# Patient Record
Sex: Male | Born: 1990 | Race: White | Hispanic: No | Marital: Single | Smoking: Current some day smoker
Health system: Southern US, Community
[De-identification: ages and names within clinical notes are randomized; demographics above are authoritative.]

## PROBLEM LIST (undated history)

## (undated) DIAGNOSIS — M549 Dorsalgia, unspecified: Secondary | ICD-10-CM

---

## 2015-02-19 ENCOUNTER — Emergency Department (HOSPITAL_COMMUNITY): Payer: Self-pay

## 2015-02-19 ENCOUNTER — Observation Stay (HOSPITAL_COMMUNITY)
Admission: EM | Admit: 2015-02-19 | Discharge: 2015-02-21 | Disposition: A | Discharge status: Home / Self Care | Payer: Self-pay | Attending: Internal Medicine | Admitting: Internal Medicine

## 2015-02-19 ENCOUNTER — Encounter (HOSPITAL_COMMUNITY): Payer: Self-pay | Admitting: Emergency Medicine

## 2015-02-19 ENCOUNTER — Emergency Department (HOSPITAL_COMMUNITY): Payer: No Typology Code available for payment source

## 2015-02-19 DIAGNOSIS — S32011A Stable burst fracture of first lumbar vertebra, initial encounter for closed fracture: Principal | ICD-10-CM | POA: Insufficient documentation

## 2015-02-19 DIAGNOSIS — S32009A Unspecified fracture of unspecified lumbar vertebra, initial encounter for closed fracture: Secondary | ICD-10-CM

## 2015-02-19 DIAGNOSIS — E876 Hypokalemia: Secondary | ICD-10-CM | POA: Insufficient documentation

## 2015-02-19 DIAGNOSIS — F17200 Nicotine dependence, unspecified, uncomplicated: Secondary | ICD-10-CM | POA: Insufficient documentation

## 2015-02-19 DIAGNOSIS — S32020A Wedge compression fracture of second lumbar vertebra, initial encounter for closed fracture: Secondary | ICD-10-CM | POA: Insufficient documentation

## 2015-02-19 DIAGNOSIS — R945 Abnormal results of liver function studies: Secondary | ICD-10-CM | POA: Diagnosis present

## 2015-02-19 DIAGNOSIS — R7989 Other specified abnormal findings of blood chemistry: Secondary | ICD-10-CM | POA: Diagnosis present

## 2015-02-19 DIAGNOSIS — Z720 Tobacco use: Secondary | ICD-10-CM | POA: Diagnosis present

## 2015-02-19 DIAGNOSIS — D72829 Elevated white blood cell count, unspecified: Secondary | ICD-10-CM | POA: Insufficient documentation

## 2015-02-19 DIAGNOSIS — R740 Nonspecific elevation of levels of transaminase and lactic acid dehydrogenase [LDH]: Secondary | ICD-10-CM | POA: Insufficient documentation

## 2015-02-19 DIAGNOSIS — S32001A Stable burst fracture of unspecified lumbar vertebra, initial encounter for closed fracture: Secondary | ICD-10-CM

## 2015-02-19 HISTORY — DX: Dorsalgia, unspecified: M54.9

## 2015-02-19 LAB — COMPREHENSIVE METABOLIC PANEL
ALT: 37 U/L (ref 17–63)
ANION GAP: 8 (ref 5–15)
AST: 45 U/L — ABNORMAL HIGH (ref 15–41)
Albumin: 3.2 g/dL — ABNORMAL LOW (ref 3.5–5.0)
Alkaline Phosphatase: 88 U/L (ref 38–126)
BUN: 8 mg/dL (ref 6–20)
CALCIUM: 9 mg/dL (ref 8.9–10.3)
CHLORIDE: 109 mmol/L (ref 101–111)
CO2: 23 mmol/L (ref 22–32)
CREATININE: 0.9 mg/dL (ref 0.61–1.24)
Glucose, Bld: 102 mg/dL — ABNORMAL HIGH (ref 65–99)
Potassium: 4 mmol/L (ref 3.5–5.1)
Sodium: 140 mmol/L (ref 135–145)
TOTAL PROTEIN: 6.4 g/dL — AB (ref 6.5–8.1)
Total Bilirubin: 0.2 mg/dL — ABNORMAL LOW (ref 0.3–1.2)

## 2015-02-19 LAB — CBC
HCT: 42.1 % (ref 39.0–52.0)
HEMOGLOBIN: 14.3 g/dL (ref 13.0–17.0)
MCH: 28.9 pg (ref 26.0–34.0)
MCHC: 34 g/dL (ref 30.0–36.0)
MCV: 85.1 fL (ref 78.0–100.0)
Platelets: 296 10*3/uL (ref 150–400)
RBC: 4.95 MIL/uL (ref 4.22–5.81)
RDW: 12.9 % (ref 11.5–15.5)
WBC: 19 10*3/uL — ABNORMAL HIGH (ref 4.0–10.5)

## 2015-02-19 LAB — MAGNESIUM: MAGNESIUM: 2 mg/dL (ref 1.7–2.4)

## 2015-02-19 LAB — PROTIME-INR
INR: 1.18 (ref 0.00–1.49)
PROTHROMBIN TIME: 15.1 s (ref 11.6–15.2)

## 2015-02-19 LAB — ETHANOL

## 2015-02-19 LAB — CK: CK TOTAL: 161 U/L (ref 49–397)

## 2015-02-19 MED ORDER — IOHEXOL 300 MG/ML  SOLN
100.0000 mL | Freq: Once | INTRAMUSCULAR | Status: AC | PRN
  Administered 2015-02-19: 100 mL via INTRAVENOUS

## 2015-02-19 MED ORDER — CYCLOBENZAPRINE HCL 10 MG PO TABS
5.0000 mg | ORAL_TABLET | ORAL | Status: DC | PRN
  Administered 2015-02-19 – 2015-02-21 (×5): 5 mg via ORAL
  Filled 2015-02-19 (×6): qty 1

## 2015-02-19 MED ORDER — CYCLOBENZAPRINE HCL 10 MG PO TABS
10.0000 mg | ORAL_TABLET | Freq: Once | ORAL | Status: DC
  Administered 2015-02-19: 10 mg via ORAL
  Filled 2015-02-19: qty 1

## 2015-02-19 MED ORDER — MORPHINE SULFATE (PF) 4 MG/ML IV SOLN
4.0000 mg | Freq: Once | INTRAVENOUS | Status: AC
  Administered 2015-02-19: 4 mg via INTRAVENOUS
  Filled 2015-02-19: qty 1

## 2015-02-19 MED ORDER — KETOROLAC TROMETHAMINE 30 MG/ML IJ SOLN
30.0000 mg | Freq: Four times a day (QID) | INTRAMUSCULAR | Status: DC
  Administered 2015-02-19 – 2015-02-20 (×4): 30 mg via INTRAVENOUS
  Filled 2015-02-19 (×4): qty 1

## 2015-02-19 MED ORDER — NICOTINE 21 MG/24HR TD PT24
21.0000 mg | MEDICATED_PATCH | Freq: Every day | TRANSDERMAL | Status: DC
  Administered 2015-02-19 – 2015-02-21 (×4): 21 mg via TRANSDERMAL
  Filled 2015-02-19 (×4): qty 1

## 2015-02-19 MED ORDER — HYDROMORPHONE HCL 1 MG/ML IJ SOLN
1.0000 mg | INTRAMUSCULAR | Status: DC | PRN
  Administered 2015-02-19 – 2015-02-20 (×4): 1 mg via INTRAVENOUS
  Filled 2015-02-19 (×4): qty 1

## 2015-02-19 MED ORDER — HYDROMORPHONE HCL 1 MG/ML IJ SOLN
1.0000 mg | Freq: Once | INTRAMUSCULAR | Status: AC
  Administered 2015-02-19: 1 mg via INTRAVENOUS
  Filled 2015-02-19: qty 1

## 2015-02-19 MED ORDER — POTASSIUM CHLORIDE IN NACL 20-0.9 MEQ/L-% IV SOLN
INTRAVENOUS | Status: DC
  Administered 2015-02-19 – 2015-02-21 (×2): via INTRAVENOUS
  Filled 2015-02-19 (×6): qty 1000

## 2015-02-19 MED ORDER — ACETAMINOPHEN 325 MG PO TABS: 650.0000 mg | ORAL_TABLET | Freq: Four times a day (QID) | ORAL | Status: DC | PRN

## 2015-02-19 MED ORDER — ONDANSETRON HCL 4 MG/2ML IJ SOLN
4.0000 mg | Freq: Four times a day (QID) | INTRAMUSCULAR | Status: DC | PRN
  Administered 2015-02-19: 4 mg via INTRAVENOUS
  Filled 2015-02-19: qty 2

## 2015-02-19 MED ORDER — FAMOTIDINE 20 MG PO TABS
20.0000 mg | ORAL_TABLET | Freq: Two times a day (BID) | ORAL | Status: DC
  Administered 2015-02-19 – 2015-02-21 (×4): 20 mg via ORAL
  Filled 2015-02-19 (×4): qty 1

## 2015-02-19 NOTE — ED Notes (Signed)
Provider at bedside
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
## 2015-02-19 NOTE — ED Notes (Signed)
Admit Doctor at bedside.  
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
## 2015-02-20 ENCOUNTER — Observation Stay (HOSPITAL_COMMUNITY): Payer: Self-pay

## 2015-02-20 DIAGNOSIS — S32001D Stable burst fracture of unspecified lumbar vertebra, subsequent encounter for fracture with routine healing: Secondary | ICD-10-CM

## 2015-02-20 DIAGNOSIS — E876 Hypokalemia: Secondary | ICD-10-CM

## 2015-02-20 LAB — CBC WITH DIFFERENTIAL/PLATELET
BASOS ABS: 0 10*3/uL (ref 0.0–0.1)
Basophils Relative: 0 %
EOS ABS: 0.2 10*3/uL (ref 0.0–0.7)
EOS PCT: 1 %
HCT: 42.3 % (ref 39.0–52.0)
Hemoglobin: 14.4 g/dL (ref 13.0–17.0)
Lymphocytes Relative: 19 %
Lymphs Abs: 3.2 10*3/uL (ref 0.7–4.0)
MCH: 28.7 pg (ref 26.0–34.0)
MCHC: 34 g/dL (ref 30.0–36.0)
MCV: 84.4 fL (ref 78.0–100.0)
Monocytes Absolute: 1.2 10*3/uL — ABNORMAL HIGH (ref 0.1–1.0)
Monocytes Relative: 7 %
Neutro Abs: 12.1 10*3/uL — ABNORMAL HIGH (ref 1.7–7.7)
Neutrophils Relative %: 73 %
PLATELETS: 304 10*3/uL (ref 150–400)
RBC: 5.01 MIL/uL (ref 4.22–5.81)
RDW: 12.9 % (ref 11.5–15.5)
WBC: 16.6 10*3/uL — AB (ref 4.0–10.5)

## 2015-02-20 LAB — COMPREHENSIVE METABOLIC PANEL
ALT: 34 U/L (ref 17–63)
AST: 33 U/L (ref 15–41)
Albumin: 3.1 g/dL — ABNORMAL LOW (ref 3.5–5.0)
Alkaline Phosphatase: 84 U/L (ref 38–126)
Anion gap: 6 (ref 5–15)
BILIRUBIN TOTAL: 0.3 mg/dL (ref 0.3–1.2)
BUN: 7 mg/dL (ref 6–20)
CO2: 25 mmol/L (ref 22–32)
CREATININE: 0.72 mg/dL (ref 0.61–1.24)
Calcium: 8.5 mg/dL — ABNORMAL LOW (ref 8.9–10.3)
Chloride: 105 mmol/L (ref 101–111)
GFR calc Af Amer: >60 mL/min (ref >60)
Glucose, Bld: 108 mg/dL — ABNORMAL HIGH (ref 65–99)
Potassium: 3.4 mmol/L — ABNORMAL LOW (ref 3.5–5.1)
SODIUM: 136 mmol/L (ref 135–145)
TOTAL PROTEIN: 6.2 g/dL — AB (ref 6.5–8.1)

## 2015-02-20 MED ORDER — HYDROMORPHONE HCL 1 MG/ML IJ SOLN
2.0000 mg | INTRAMUSCULAR | Status: DC | PRN
  Administered 2015-02-20 – 2015-02-21 (×7): 2 mg via INTRAVENOUS
  Filled 2015-02-20 (×8): qty 2
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
## 2015-02-20 NOTE — Progress Notes (Signed)
OT Cancellation Note  Patient Details Name: Jerry PastelCorbin Wells MRN: [id number] DOB: 07/21/1990   Cancelled Treatment:    Reason Eval/Treat Not Completed: Patient not medically ready. PT/OT Contacted MD regarding activity order, per MD hold PT/OT until upright lumbar spine radiograph obtained. OT will continue to follow acutely, thank you for this order.  Earlie RavelingStraub, Kameran Mcneese L OTR/L [phone] 02/20/2015, 3:15 PM

## 2015-02-20 NOTE — Progress Notes (Signed)
Orthopedic Tech Progress Note Patient Details:  Jerry Wells 09/07/1990 [id number] Brace order completed by [employer] vendor. Patient ID: Jerry PastelCorbin Wells, male   DOB: 08/24/1990, 24 y.o.   MRN: [id number]   Jerry Wells, Jerry Wells 02/20/2015, 11:42 AM

## 2015-02-20 NOTE — Progress Notes (Signed)
PT Cancellation Note  Patient Details Name: Jerry Wells MRN: [id number] DOB: 04/29/1991   Cancelled Treatment:    Reason Eval/Treat Not Completed: Patient not medically ready.  Contacted MD regarding activity order, per MD hold PT/OT until upright lumbar spine radiograph obtained.  PT will continue to follow acutely, thank you for this order.  Michail JewelsAshley Parr PT, DPT [phone] Pager: [phone] 02/20/2015, 3:13 PM

## 2015-02-20 NOTE — Progress Notes (Signed)
Orthopedic Tech Progress Note Patient Details:  Norton PastelCorbin Whipkey 08/02/1990 [id number] Called [employer] for brace order. Patient ID: Norton PastelCorbin Lad, male   DOB: 07/04/1990, 24 y.o.   MRN: [id number]   Jennye MoccasinHughes, Tobby Fawcett Craig 02/20/2015, 9:47 AM
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
## 2015-02-20 NOTE — Progress Notes (Signed)
Ortho paged to get TLSO brace per Md order.

## 2015-02-21 DIAGNOSIS — D72829 Elevated white blood cell count, unspecified: Secondary | ICD-10-CM

## 2015-02-21 DIAGNOSIS — Z720 Tobacco use: Secondary | ICD-10-CM | POA: Diagnosis present

## 2015-02-21 DIAGNOSIS — E876 Hypokalemia: Secondary | ICD-10-CM | POA: Diagnosis present

## 2015-02-21 MED ORDER — NICOTINE 21 MG/24HR TD PT24: 21.0000 mg | MEDICATED_PATCH | Freq: Every day | TRANSDERMAL | Status: AC

## 2015-02-21 MED ORDER — OXYCODONE-ACETAMINOPHEN 10-325 MG PO TABS: 1.0000 | ORAL_TABLET | ORAL | Status: AC | PRN

## 2015-02-21 MED ORDER — CYCLOBENZAPRINE HCL 5 MG PO TABS: 5.0000 mg | ORAL_TABLET | Freq: Four times a day (QID) | ORAL | Status: DC | PRN

## 2015-02-21 MED ORDER — NICOTINE 21 MG/24HR TD PT24: 21.0000 mg | MEDICATED_PATCH | Freq: Every day | TRANSDERMAL | Status: DC

## 2015-02-21 MED ORDER — DOCUSATE SODIUM 100 MG PO CAPS: 100.0000 mg | ORAL_CAPSULE | Freq: Every day | ORAL | Status: AC | PRN
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
## 2015-02-21 NOTE — Discharge Instructions (Signed)
Lumbar Fracture °A lumbar fracture is a break in one of the bones of the lower back. Lumbar fractures range in severity. Severe fractures can damage the spinal cord. °CAUSES °This condition may be caused by: °· A fall (common). °· A car accident (common). °· A gunshot wound. °· A hard, direct hit to the back. °· Osteoporosis. °SYMPTOMS °The main symptom of this condition is severe pain in the lower back. If a fracture is complex or severe, there may also be: °· A misshapen or swollen area on the lower back. °· A limited ability to move an area of the lower back. °· An inability to empty the bladder or bowel. °· A loss of strength or sensation in the legs, feet, and toes. °· Paralysis. °DIAGNOSIS °This condition is diagnosed based on: °· A physical exam. °· Symptoms and what happened just before they developed. °· The results of imaging tests, such as an X-ray, CT scan, or MRI. °If your nerves have been damaged, you may also have other tests to find out how much damage there is. °TREATMENT °Treatment for this condition depends on the specifics of the injury. Most fractures can be treated with: °· A back brace. °· Bed rest and activity restrictions. °· Pain medicine. °· Physical therapy. °Fractures that are complex, involve multiple bones, or make the spine unstable may require surgery to remove pressure from the nerves or spinal cord and to stabilize the broken pieces of bone. °During recovery, it is normal to have pain and stiffness in the back for weeks. °HOME CARE INSTRUCTIONS °Medicines °· Take medicines only as directed by your health care provider. °· Do not drive or operate heavy machinery while taking pain medicine. ° Activity °· Stay in bed for as long as directed by your health care provider. °· If you were shown how to do any exercises to improve motion and strength in your back, do them as directed by your health care provider. °· Return to your normal activities as directed by your health care provider.  Ask your health care provider what activities are safe for you. °General Instructions °· If you were given a neck brace or back brace, wear it as directed by your health care provider. °· Keep all follow-up visits as directed by your health care provider. This is important. Failure to follow-up as recommended could result in permanent injury, disability, and long-lasting (chronic) pain. °SEEK MEDICAL CARE IF: °· Your pain does not improve over time. °· You have a persistent cough. °· You cannot return to your normal activities as planned or expected. °SEEK IMMEDIATE MEDICAL CARE IF: °· You have severe pain or your pain suddenly gets worse. °· You are unable to move. °· You have numbness, tingling, weakness, or paralysis in any part of your body. °· You cannot control your bladder or bowel. °· You have difficulty breathing. °· You have a fever. °· You have pain in your chest or abdomen. °· You vomit. °  °This information is not intended to replace advice given to you by your health care provider. Make sure you discuss any questions you have with your health care provider. °  °Document Released: 08/02/2006 Document Revised: 09/01/2014 Document Reviewed: 04/13/2014 °[employer]vier Interactive Patient Education ©2016 [employer] ° °
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
## 2015-02-21 NOTE — Progress Notes (Signed)
Patient continues to complain of back pain, however it seems to be improving. AVSS Neurologically intact I reviewed his upright lumbar films. This fracture appears to be stable when he is upright in the brace. He is doing well and I hope that we'll be able to get by with bracing only. He may be discharged at this time. I would recommend sending him home with opioids and muscle relaxants. He will follow up with me in 4 weeks with upright films.
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
## 2015-02-21 NOTE — Progress Notes (Signed)
Pt discharged at this time alert, verbal taking all personal belongings. Family at bedside. IV discontinued, dry dressing applied. Discharge instructions provided along with prescriptions with verbal understanding. Pt and mother made aware to follow up with Md per discharge summary. No noted distress.

## 2015-02-21 NOTE — Progress Notes (Signed)
Family requested a hospital bed for home. Spoke with CM who requested this nurse to contact Md for order. Md paged.

## 2015-02-23 ENCOUNTER — Telehealth: Payer: Self-pay | Admitting: *Deleted

## 2015-02-23 MED ORDER — IOHEXOL 300 MG/ML  SOLN
100.0000 mL | Freq: Once | INTRAMUSCULAR | Status: AC | PRN
  Administered 2015-02-19: 100 mL via INTRAVENOUS

## 2015-02-24 ENCOUNTER — Ambulatory Visit: Payer: Self-pay

## 2015-02-24 ENCOUNTER — Ambulatory Visit: Payer: Self-pay | Attending: Family Medicine | Admitting: Family Medicine

## 2015-02-24 ENCOUNTER — Encounter: Payer: Self-pay | Admitting: Family Medicine

## 2015-02-24 VITALS — BP 163/106 | HR 124 | Temp 98.2°F | Resp 18 | Ht 71.0 in | Wt 153.0 lb

## 2015-02-24 DIAGNOSIS — IMO0001 Reserved for inherently not codable concepts without codable children: Secondary | ICD-10-CM

## 2015-02-24 DIAGNOSIS — S32001D Stable burst fracture of unspecified lumbar vertebra, subsequent encounter for fracture with routine healing: Secondary | ICD-10-CM

## 2015-02-24 DIAGNOSIS — F17200 Nicotine dependence, unspecified, uncomplicated: Secondary | ICD-10-CM | POA: Insufficient documentation

## 2015-02-24 DIAGNOSIS — S32011D Stable burst fracture of first lumbar vertebra, subsequent encounter for fracture with routine healing: Secondary | ICD-10-CM | POA: Insufficient documentation

## 2015-02-24 DIAGNOSIS — R030 Elevated blood-pressure reading, without diagnosis of hypertension: Secondary | ICD-10-CM

## 2015-02-24 DIAGNOSIS — Z882 Allergy status to sulfonamides status: Secondary | ICD-10-CM | POA: Insufficient documentation
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
## 2015-02-24 NOTE — Patient Instructions (Signed)
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
## 2015-03-02 ENCOUNTER — Ambulatory Visit: Payer: Self-pay | Admitting: Family Medicine

## 2016-04-04 ENCOUNTER — Encounter (HOSPITAL_COMMUNITY): Payer: Self-pay | Admitting: Emergency Medicine

## 2016-04-04 ENCOUNTER — Emergency Department (HOSPITAL_COMMUNITY): Payer: Self-pay

## 2016-04-04 ENCOUNTER — Emergency Department (HOSPITAL_COMMUNITY)
Admission: EM | Admit: 2016-04-04 | Discharge: 2016-04-04 | Disposition: A | Discharge status: Home / Self Care | Payer: Self-pay | Attending: Emergency Medicine | Admitting: Emergency Medicine

## 2016-04-04 DIAGNOSIS — Y939 Activity, unspecified: Secondary | ICD-10-CM | POA: Insufficient documentation

## 2016-04-04 DIAGNOSIS — M5010 Cervical disc disorder with radiculopathy, unspecified cervical region: Secondary | ICD-10-CM | POA: Insufficient documentation

## 2016-04-04 DIAGNOSIS — Y999 Unspecified external cause status: Secondary | ICD-10-CM | POA: Insufficient documentation

## 2016-04-04 DIAGNOSIS — M5410 Radiculopathy, site unspecified: Secondary | ICD-10-CM

## 2016-04-04 DIAGNOSIS — S060X1A Concussion with loss of consciousness of 30 minutes or less, initial encounter: Secondary | ICD-10-CM | POA: Insufficient documentation

## 2016-04-04 DIAGNOSIS — F17200 Nicotine dependence, unspecified, uncomplicated: Secondary | ICD-10-CM | POA: Insufficient documentation

## 2016-04-04 DIAGNOSIS — Y92149 Unspecified place in prison as the place of occurrence of the external cause: Secondary | ICD-10-CM | POA: Insufficient documentation

## 2016-04-04 DIAGNOSIS — S060X1D Concussion with loss of consciousness of 30 minutes or less, subsequent encounter: Secondary | ICD-10-CM

## 2016-04-04 DIAGNOSIS — M5090 Cervical disc disorder, unspecified, unspecified cervical region: Secondary | ICD-10-CM

## 2016-04-04 DIAGNOSIS — S32000S Wedge compression fracture of unspecified lumbar vertebra, sequela: Secondary | ICD-10-CM | POA: Insufficient documentation

## 2016-04-04 DIAGNOSIS — Z79899 Other long term (current) drug therapy: Secondary | ICD-10-CM | POA: Insufficient documentation

## 2016-04-04 MED ORDER — CYCLOBENZAPRINE HCL 10 MG PO TABS: 10.0000 mg | ORAL_TABLET | Freq: Two times a day (BID) | ORAL | 0 refills | Status: AC | PRN

## 2016-04-04 MED ORDER — KETOROLAC TROMETHAMINE 60 MG/2ML IM SOLN
60.0000 mg | Freq: Once | INTRAMUSCULAR | Status: DC
  Filled 2016-04-04: qty 2

## 2016-04-04 MED ORDER — GABAPENTIN 300 MG PO CAPS: 300.0000 mg | ORAL_CAPSULE | Freq: Three times a day (TID) | ORAL | 0 refills | Status: AC

## 2016-04-04 NOTE — ED Triage Notes (Signed)
Pt is alert and oriented at triage

## 2016-04-04 NOTE — ED Notes (Signed)
Patient transported to X-ray 
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
## 2017-04-05 IMAGING — DX DG LUMBAR SPINE 2-3V
3 series · 3 of 3 positions shown · non-contrast
Comparison: 02/19/2015

CLINICAL DATA: Fracture lumbar vertebra, closed, standing images
with brace

EXAM:
LUMBAR SPINE - 2-3 VIEW

[w lumbar spine ap]
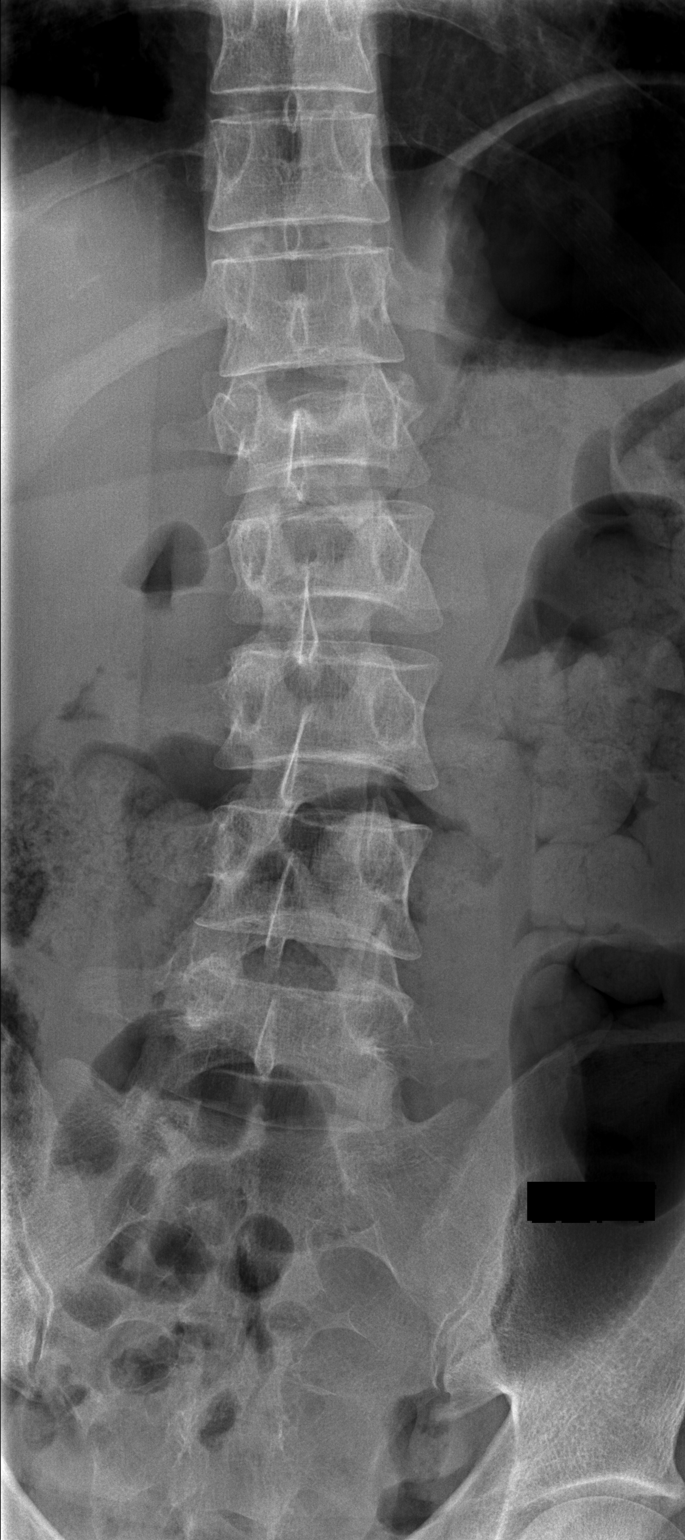

[w lumbar spine lat]
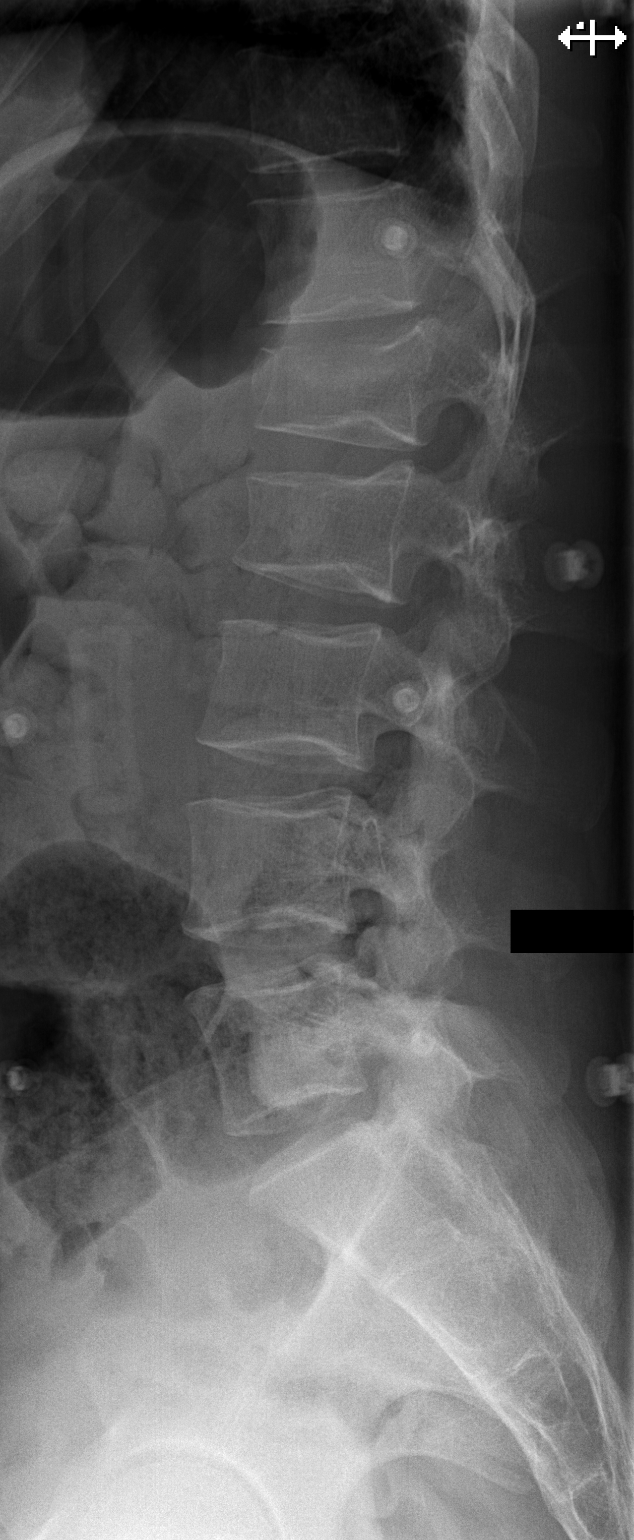

[w lumbar l-5 s-1 spot]
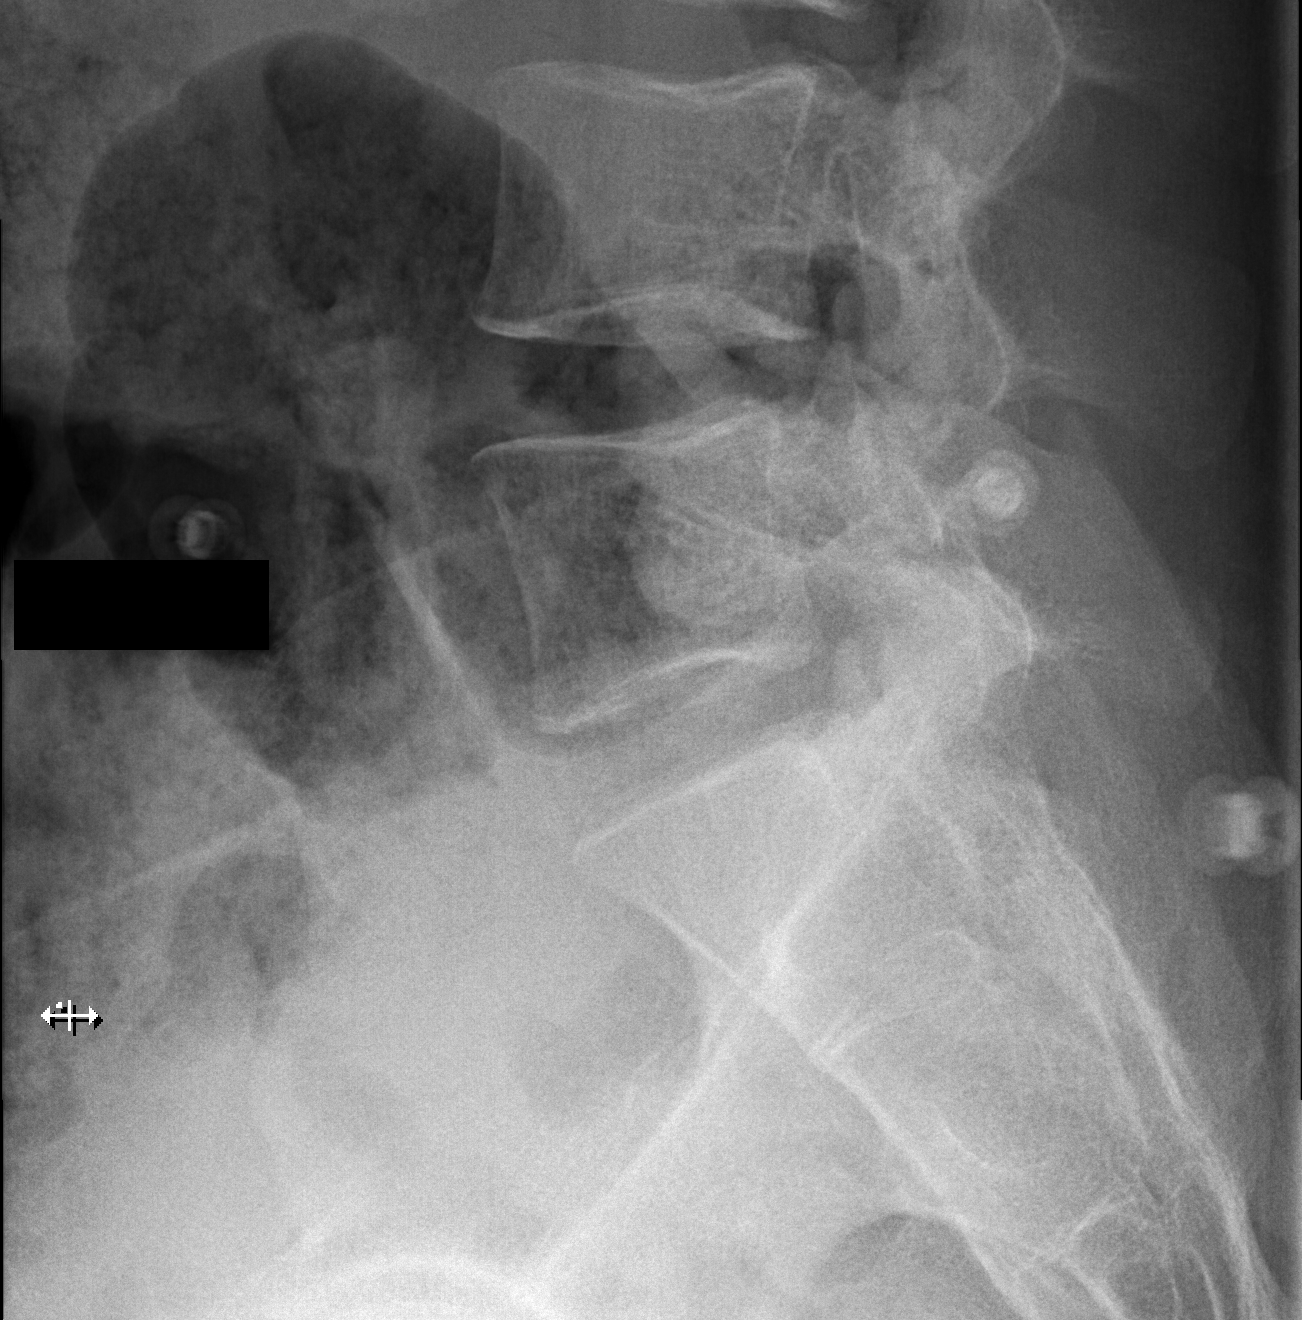

[3 of 3 positions shown; findings below may reference images not displayed]

FINDINGS: Five non-rib-bearing lumbar vertebra.

L1 fracture with anterior height loss and minimal retropulsion of a
posterior superior fragment appears unchanged.

Superior endplate height loss of L2 vertebral body appears
unchanged.

Bones appear demineralized.

No additional fracture or subluxation.
IMPRESSION: Fractures of L1 and L2 vertebral bodies unchanged since 02/19/2015.

## 2018-05-19 IMAGING — CR DG THORACIC SPINE 2V
3 series · 3 of 3 positions shown · non-contrast
Comparison: 12/20/2015.

CLINICAL DATA: Back pain.  Prior fracture.

EXAM:
THORACIC SPINE 2 VIEWS

[t-spine ap]
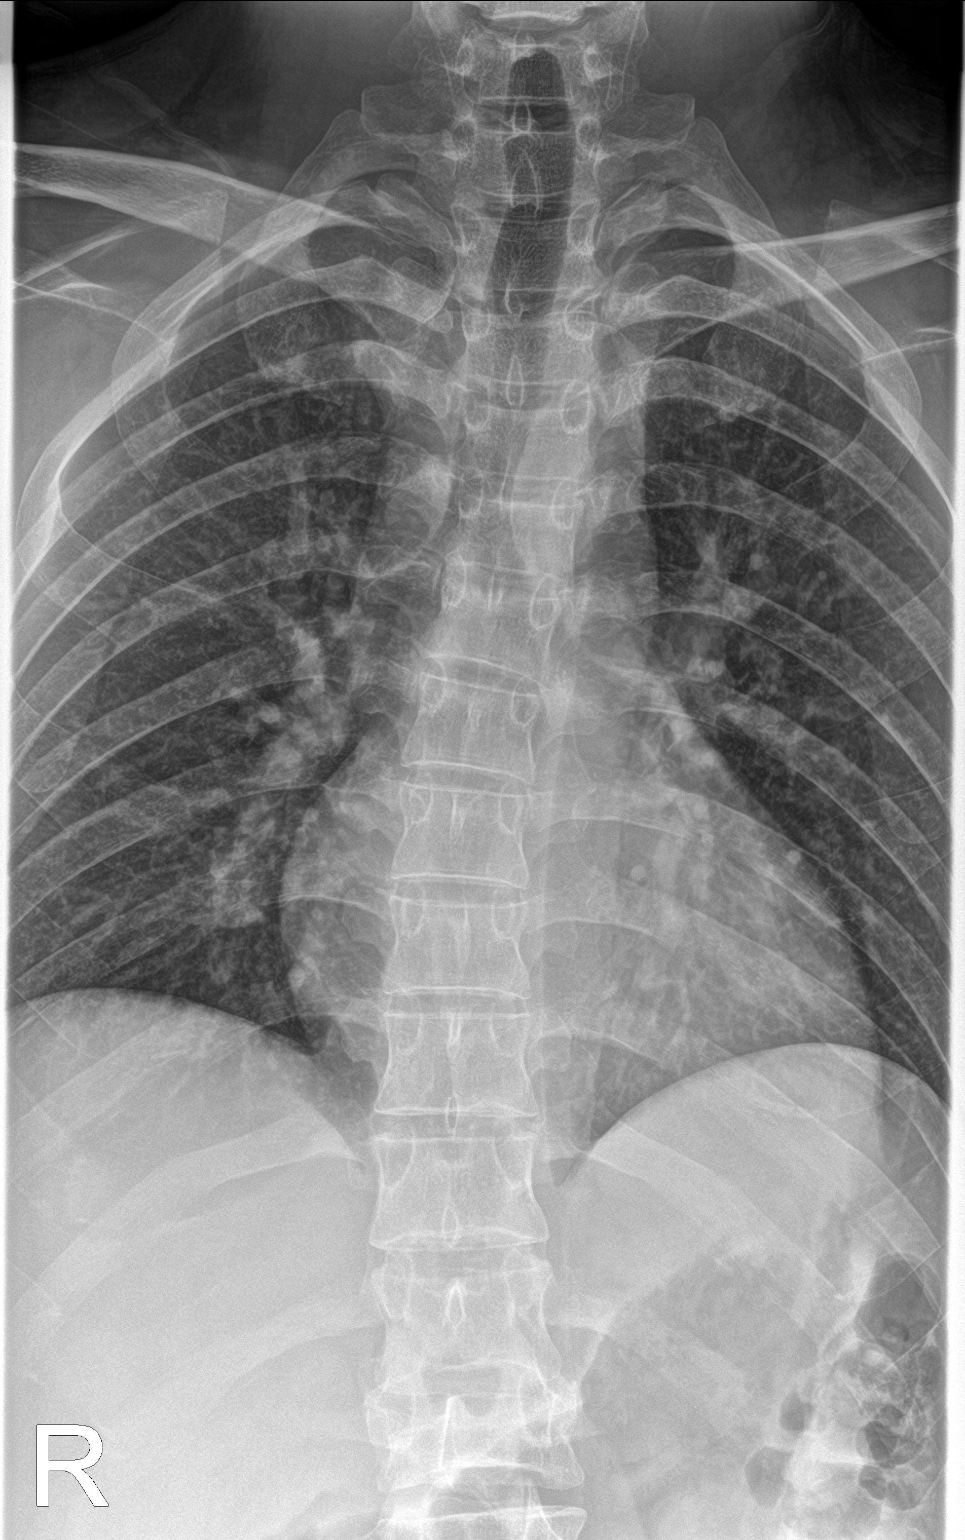

[t-spine lat]
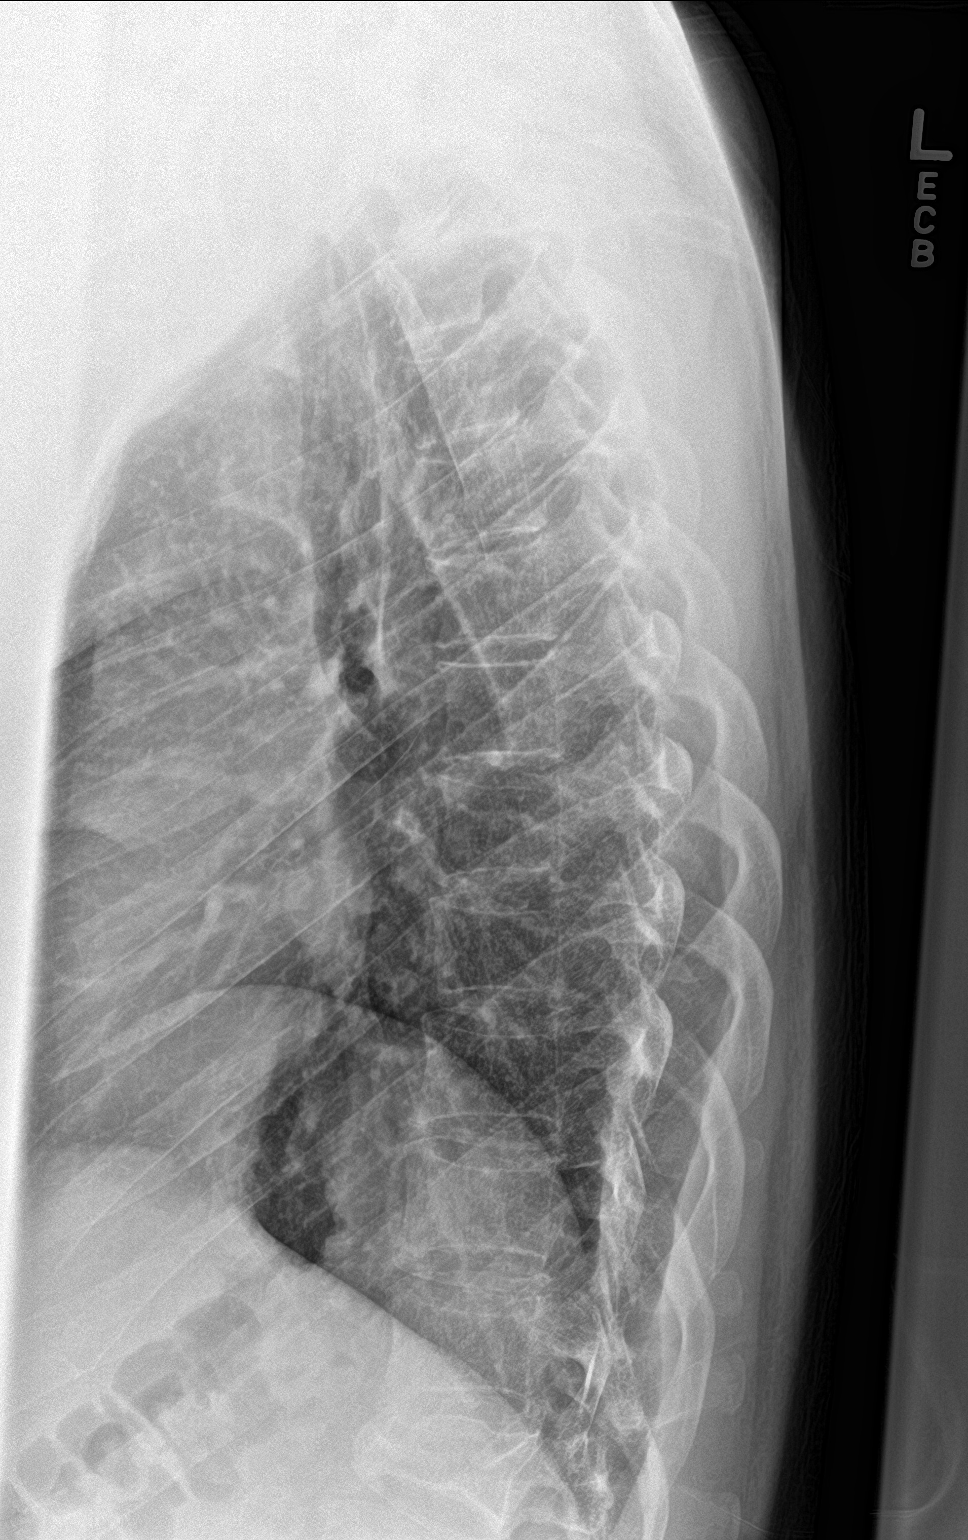

[t-spine swimmers]
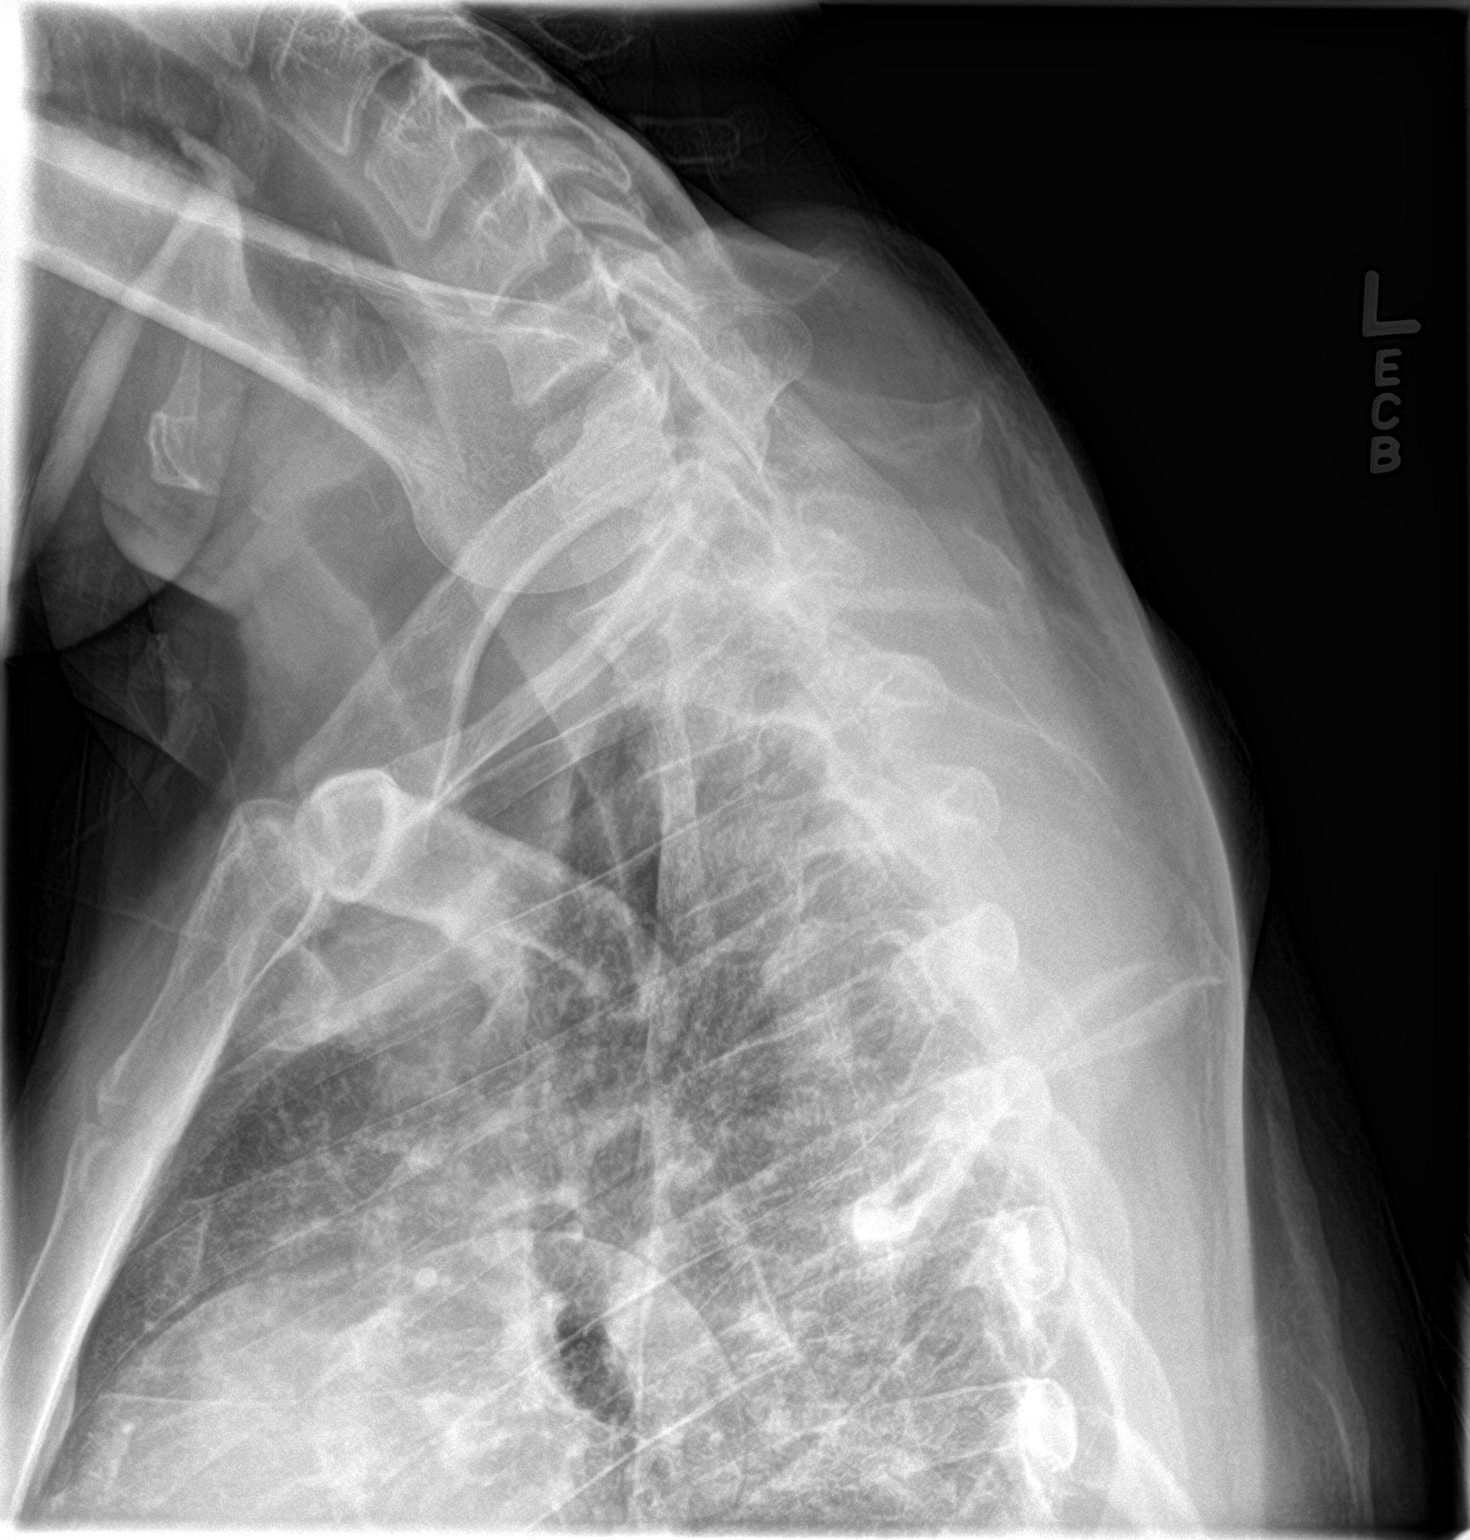

[3 of 3 positions shown; findings below may reference images not displayed]

FINDINGS: Thoracic spine S shaped scoliosis noted. T12 prominent compression
fracture again noted. No acute new fractures identified.
IMPRESSION: CT spine S shaped scoliosis P T12 old prominent compression fracture
noted. No acute abnormality identified. MRI can be obtained for
further evaluation as needed.
